# Patient Record
Sex: Female | Born: 1970 | Race: White | Hispanic: No | Smoking: Never smoker
Health system: Southern US, Community
[De-identification: ages and names within clinical notes are randomized; demographics above are authoritative.]

## PROBLEM LIST (undated history)

## (undated) DIAGNOSIS — F419 Anxiety disorder, unspecified: Secondary | ICD-10-CM

## (undated) DIAGNOSIS — Z62810 Personal history of physical and sexual abuse in childhood: Secondary | ICD-10-CM

## (undated) HISTORY — PX: CHOLECYSTECTOMY: SHX55

## (undated) HISTORY — PX: ABDOMINAL HYSTERECTOMY: SHX81

---

## 2004-09-03 ENCOUNTER — Emergency Department (HOSPITAL_COMMUNITY): Admission: EM | Admit: 2004-09-03 | Discharge: 2004-09-03 | Payer: Self-pay | Admitting: *Deleted

## 2006-04-04 IMAGING — CR DG HAND COMPLETE 3+V*L*
4 series · 4 of 4 positions shown · non-contrast
Comparison: none

CLINICAL DATA: Fall.
LEFT KNEE ? 4 VIEW, 09/03/04 AT 6658 HOURS:

[view not recorded (1 of 4)]
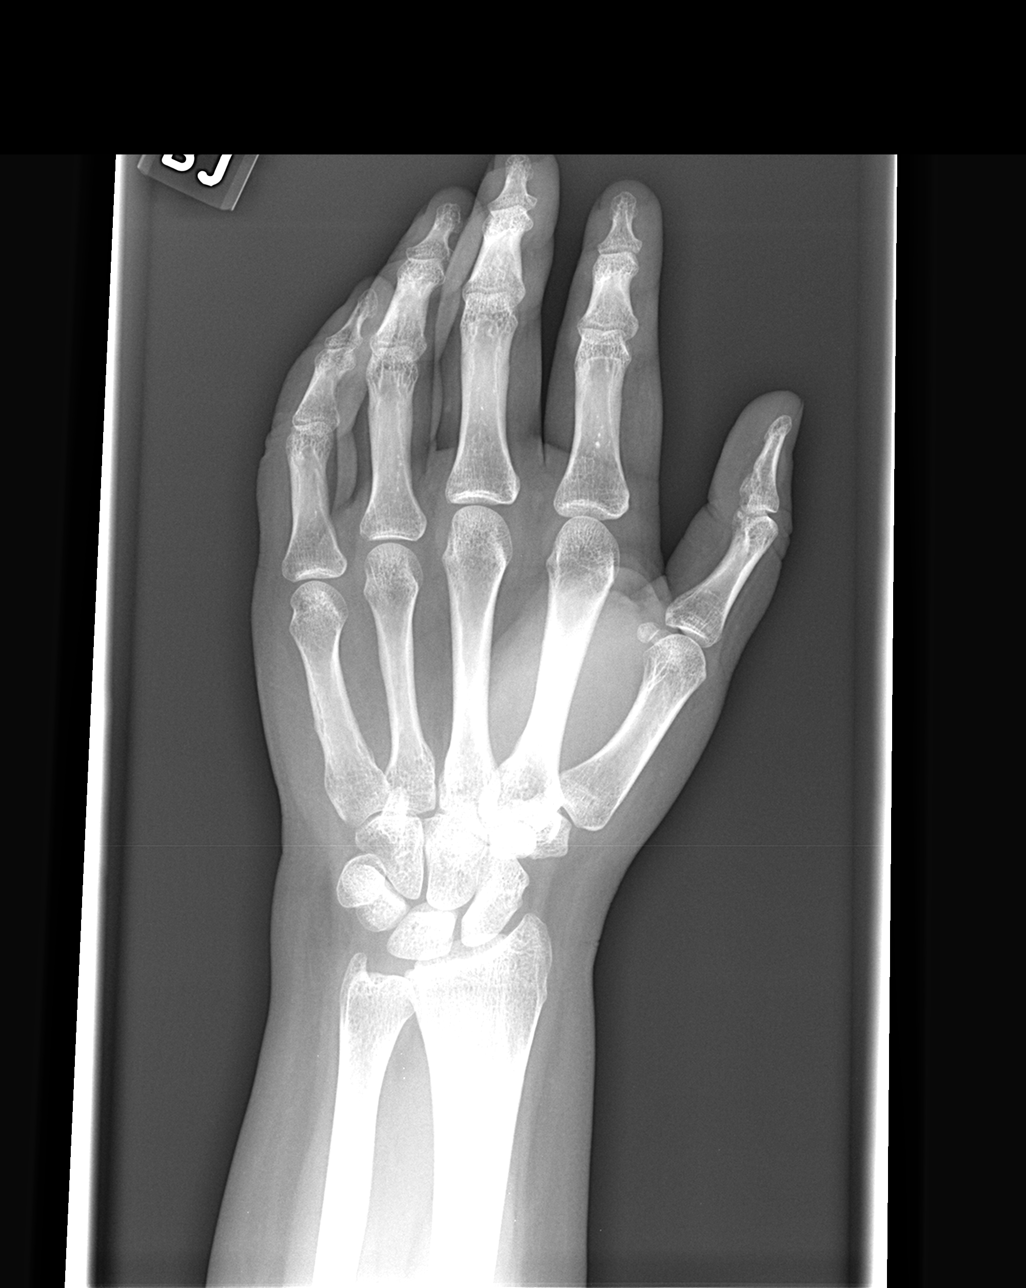

[view not recorded (2 of 4)]
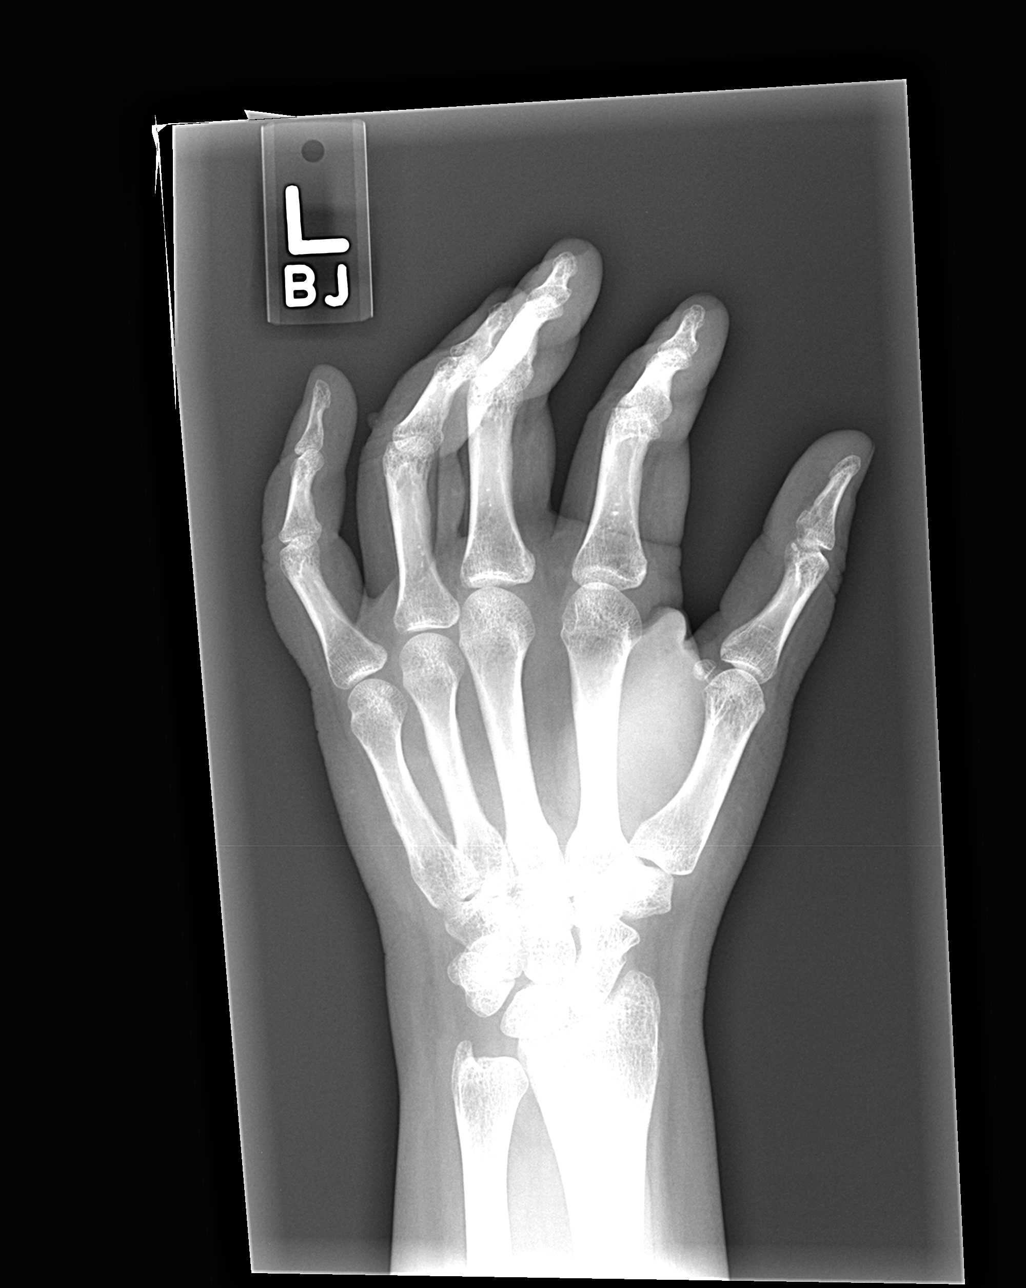

[view not recorded (3 of 4)]
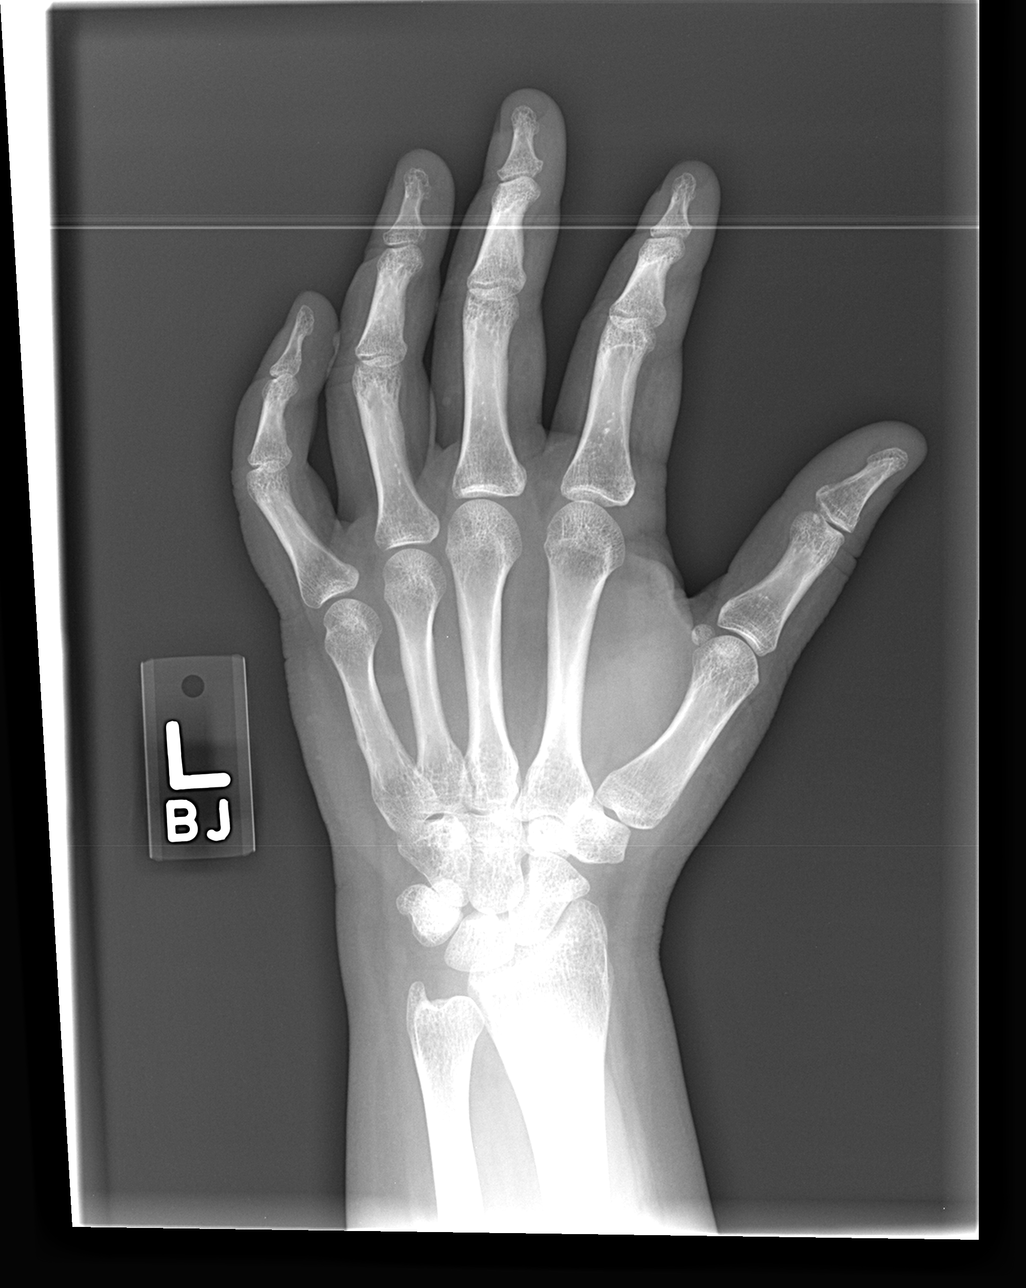

[view not recorded (4 of 4)]
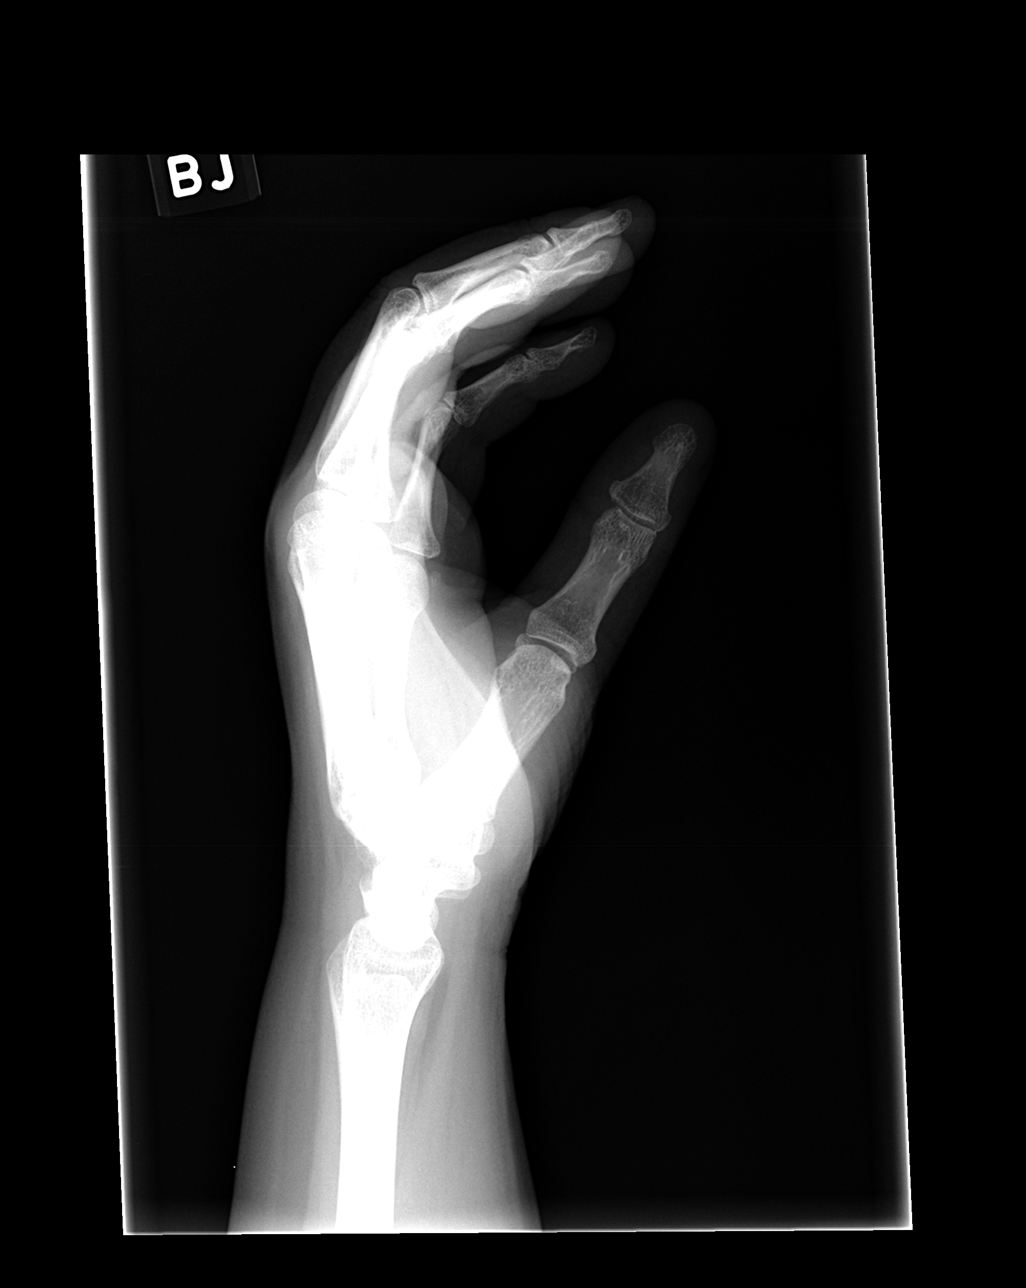

[4 of 4 positions shown; findings below may reference images not displayed]

FINDINGS: No fractures or dislocations are seen.  Radiodensities project over the soft tissues of the calf.  This represents artifact versus foreign bodies in the soft tissues.
IMPRESSION: No fracture.  Radiodensities project over the soft tissues of the calf. 
RIGHT KNEE ? 4 VIEW, 09/03/04 AT 6658 HOURS:
FINDINGS: No fractures or dislocations are seen.  A tiny metallic density projects over the proximal tibia, either artifact or foreign body.
IMPRESSION: No fracture.  Tiny foreign body versus artifact projects over the tibia.
LEFT WRIST  - 4 VIEW, 09/03/04 AT 6658 HOURS:
FINDINGS: No acute fractures or dislocations are seen.  The scaphoid is intact.  A tiny density lateral to the triquetrum is present, either a soft tissue calcification, artifact, or foreign body.  Correlate clinically.
IMPRESSION: No fracture.  Tiny foreign body versus artifact versus soft tissue calcification adjacent to triquetrum is noted. 
LEFT HAND ? 3 VIEW, 09/03/04 AT 6658 HOURS:
FINDINGS: A punctate density between the third and fourth metacarpals is most likely an artifact.  No fractures or dislocations are seen.
IMPRESSION: No evidence of acute bony injury lateral to the triquetrum.

## 2006-05-04 ENCOUNTER — Emergency Department: Payer: Self-pay | Admitting: Emergency Medicine

## 2016-01-08 ENCOUNTER — Emergency Department (HOSPITAL_COMMUNITY)
Admission: EM | Admit: 2016-01-08 | Discharge: 2016-01-09 | Disposition: A | Discharge status: Other Acute Inpatient Hospital | Payer: Self-pay | Attending: Emergency Medicine | Admitting: Emergency Medicine

## 2016-01-08 ENCOUNTER — Encounter (HOSPITAL_COMMUNITY): Payer: Self-pay

## 2016-01-08 DIAGNOSIS — Z79899 Other long term (current) drug therapy: Secondary | ICD-10-CM | POA: Insufficient documentation

## 2016-01-08 DIAGNOSIS — Z79891 Long term (current) use of opiate analgesic: Secondary | ICD-10-CM | POA: Insufficient documentation

## 2016-01-08 DIAGNOSIS — F419 Anxiety disorder, unspecified: Secondary | ICD-10-CM | POA: Insufficient documentation

## 2016-01-08 HISTORY — DX: Personal history of physical and sexual abuse in childhood: Z62.810

## 2016-01-08 HISTORY — DX: Anxiety disorder, unspecified: F41.9

## 2016-01-08 LAB — BASIC METABOLIC PANEL
Anion gap: 6 (ref 5–15)
BUN: 15 mg/dL (ref 6–20)
CALCIUM: 9 mg/dL (ref 8.9–10.3)
CHLORIDE: 104 mmol/L (ref 101–111)
CO2: 28 mmol/L (ref 22–32)
Creatinine, Ser: 0.65 mg/dL (ref 0.44–1.00)
GFR calc Af Amer: >60 mL/min (ref >60)
GLUCOSE: 102 mg/dL — AB (ref 65–99)
Potassium: 3.7 mmol/L (ref 3.5–5.1)
SODIUM: 138 mmol/L (ref 135–145)

## 2016-01-08 LAB — RAPID URINE DRUG SCREEN, HOSP PERFORMED
Amphetamines: NOT DETECTED
BARBITURATES: NOT DETECTED
Benzodiazepines: NOT DETECTED
COCAINE: NOT DETECTED
OPIATES: NOT DETECTED
Tetrahydrocannabinol: NOT DETECTED

## 2016-01-08 LAB — CBC WITH DIFFERENTIAL/PLATELET
BASOS ABS: 0 10*3/uL (ref 0.0–0.1)
BASOS PCT: 0 %
EOS ABS: 0.3 10*3/uL (ref 0.0–0.7)
EOS PCT: 6 %
HCT: 40.5 % (ref 36.0–46.0)
Hemoglobin: 13.6 g/dL (ref 12.0–15.0)
Lymphocytes Relative: 33 %
Lymphs Abs: 1.5 10*3/uL (ref 0.7–4.0)
MCH: 30.4 pg (ref 26.0–34.0)
MCHC: 33.6 g/dL (ref 30.0–36.0)
MCV: 90.6 fL (ref 78.0–100.0)
MONO ABS: 0.3 10*3/uL (ref 0.1–1.0)
Monocytes Relative: 8 %
Neutro Abs: 2.4 10*3/uL (ref 1.7–7.7)
Neutrophils Relative %: 53 %
PLATELETS: 240 10*3/uL (ref 150–400)
RBC: 4.47 MIL/uL (ref 3.87–5.11)
RDW: 12.3 % (ref 11.5–15.5)
WBC: 4.5 10*3/uL (ref 4.0–10.5)

## 2016-01-08 LAB — ETHANOL: Alcohol, Ethyl (B): <5 mg/dL (ref <5)

## 2016-01-08 LAB — PREGNANCY, URINE: Preg Test, Ur: NEGATIVE

## 2016-01-08 MED ORDER — ESTRADIOL 1 MG PO TABS
0.5000 mg | ORAL_TABLET | Freq: Every day | ORAL | Status: DC
  Administered 2016-01-08: 0.5 mg via ORAL
  Filled 2016-01-08 (×2): qty 0.5

## 2016-01-08 MED ORDER — ESTRADIOL 1 MG PO TABS
ORAL_TABLET | ORAL | Status: AC
  Filled 2016-01-08: qty 1

## 2016-01-08 MED ORDER — CLONAZEPAM 0.5 MG PO TABS
0.5000 mg | ORAL_TABLET | Freq: Two times a day (BID) | ORAL | Status: DC
  Administered 2016-01-08: 0.5 mg via ORAL
  Filled 2016-01-08: qty 1

## 2016-01-08 MED ORDER — ZOLPIDEM TARTRATE 5 MG PO TABS
5.0000 mg | ORAL_TABLET | Freq: Every evening | ORAL | Status: DC | PRN
  Administered 2016-01-08: 5 mg via ORAL
  Filled 2016-01-08: qty 1

## 2016-01-08 MED ORDER — ACETAMINOPHEN 325 MG PO TABS
650.0000 mg | ORAL_TABLET | ORAL | Status: DC | PRN
  Administered 2016-01-08 – 2016-01-09 (×3): 650 mg via ORAL
  Filled 2016-01-08 (×3): qty 2

## 2016-01-08 MED ORDER — LORAZEPAM 0.5 MG PO TABS
0.5000 mg | ORAL_TABLET | Freq: Once | ORAL | Status: AC
  Administered 2016-01-08: 0.5 mg via ORAL
  Filled 2016-01-08: qty 1

## 2016-01-08 MED ORDER — SERTRALINE HCL 50 MG PO TABS
50.0000 mg | ORAL_TABLET | Freq: Every day | ORAL | Status: DC
  Administered 2016-01-08: 50 mg via ORAL
  Filled 2016-01-08: qty 1

## 2016-01-08 MED ORDER — ONDANSETRON HCL 4 MG PO TABS
4.0000 mg | ORAL_TABLET | Freq: Three times a day (TID) | ORAL | Status: DC | PRN
  Administered 2016-01-08: 4 mg via ORAL
  Filled 2016-01-08: qty 1

## 2016-01-08 MED ORDER — THYROID 30 MG PO TABS
120.0000 mg | ORAL_TABLET | Freq: Every day | ORAL | Status: DC
  Filled 2016-01-08: qty 1

## 2016-01-08 MED ORDER — ESTRADIOL 0.5 MG PO TABS: 0.5000 mg | ORAL_TABLET | Freq: Every day | ORAL | Status: DC

## 2016-01-08 NOTE — ED Notes (Signed)
Patient complains of headache and nausea. See MAR.
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
## 2016-01-08 NOTE — BH Assessment (Signed)
Per Renata Capriceonrad, DNP - Patient meets criteria for inpatient hospitalization.  [facility].  CSW will seek placement.
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
## 2016-01-08 NOTE — ED Notes (Signed)
Pt's family arrived to visit patient. Approved by this RN.

## 2016-01-08 NOTE — ED Notes (Signed)
Pt ambulatory to restroom at this time. 

## 2016-01-08 NOTE — Progress Notes (Signed)
[facility] [address] [place], [place], and [place]. CSW will continue to follow up in the morning.  [place] Abtsatia Mayumi Summerson, LCSWA Disposition staff 01/08/2016 11:13 PM

## 2016-01-08 NOTE — ED Notes (Signed)
Pt requesting to speak with TTS. States that she does not feel like she needs inpatient treatment. Informed pt that I would let TTS know, but ultimately [facility] and [facility]. Pt verbalized understanding. Per TTS, they will re-assess in the morning, but due to the patient's statements during their assessment, pt would more than likely still be recommended to go inpatient.

## 2016-01-08 NOTE — ED Notes (Signed)
TTS in progress 
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
## 2016-01-08 NOTE — ED Notes (Signed)
Patient unable to provide urine specimen at this time.

## 2016-01-09 NOTE — ED Notes (Signed)
Pt leaving with pelham at this time, ambulatory.  [place] sent with EMTALA, voluntary consent, e-signature, medical necessity, carelink/transfer, and facesheet.  [place] given 2 bags of clothing.

## 2016-01-09 NOTE — ED Notes (Signed)
Pt c/o headache, acetaminophen given as ordered; pt sitting up in bed

## 2016-01-09 NOTE — BH Assessment (Signed)
Pt has been accepted to [facility] ([facility]). Pt may arrive after 0900. Accepting and Attending is Dr.Kescavpal Betti Cruzeddy. Call report to [phone]. Tina,RN has been informed of acceptance.

## 2016-01-09 NOTE — ED Notes (Signed)
Pt has been accepted at [facility] and can be transported after 9am

## 2016-01-09 NOTE — ED Notes (Signed)
Regina Wilkins called to get medical hx on pt

## 2016-01-09 NOTE — ED Notes (Signed)
Meal given to pt.

## 2016-01-09 NOTE — ED Notes (Signed)
Family at bedside. 

## 2016-04-06 ENCOUNTER — Emergency Department (HOSPITAL_COMMUNITY): Payer: Self-pay

## 2016-04-06 ENCOUNTER — Encounter (HOSPITAL_COMMUNITY): Payer: Self-pay | Admitting: Emergency Medicine

## 2016-04-06 ENCOUNTER — Emergency Department (HOSPITAL_COMMUNITY)
Admission: EM | Admit: 2016-04-06 | Discharge: 2016-04-07 | Disposition: A | Discharge status: Home / Self Care | Payer: Self-pay | Attending: Emergency Medicine | Admitting: Emergency Medicine

## 2016-04-06 DIAGNOSIS — R112 Nausea with vomiting, unspecified: Secondary | ICD-10-CM | POA: Insufficient documentation

## 2016-04-06 DIAGNOSIS — R197 Diarrhea, unspecified: Secondary | ICD-10-CM | POA: Insufficient documentation

## 2016-04-06 DIAGNOSIS — M545 Low back pain: Secondary | ICD-10-CM | POA: Insufficient documentation

## 2016-04-06 DIAGNOSIS — J0100 Acute maxillary sinusitis, unspecified: Secondary | ICD-10-CM | POA: Insufficient documentation

## 2016-04-06 DIAGNOSIS — Z79899 Other long term (current) drug therapy: Secondary | ICD-10-CM | POA: Insufficient documentation

## 2016-04-06 DIAGNOSIS — R1013 Epigastric pain: Secondary | ICD-10-CM | POA: Insufficient documentation

## 2016-04-06 LAB — URINALYSIS, ROUTINE W REFLEX MICROSCOPIC
BILIRUBIN URINE: NEGATIVE
Glucose, UA: NEGATIVE mg/dL
HGB URINE DIPSTICK: NEGATIVE
Ketones, ur: 15 mg/dL — AB
Leukocytes, UA: NEGATIVE
NITRITE: NEGATIVE
PROTEIN: NEGATIVE mg/dL
Specific Gravity, Urine: >1.03 — ABNORMAL HIGH (ref 1.005–1.030)
pH: 6 (ref 5.0–8.0)

## 2016-04-06 LAB — CBC
HCT: 39.9 % (ref 36.0–46.0)
Hemoglobin: 13.1 g/dL (ref 12.0–15.0)
MCH: 30.5 pg (ref 26.0–34.0)
MCHC: 32.8 g/dL (ref 30.0–36.0)
MCV: 92.8 fL (ref 78.0–100.0)
PLATELETS: 256 10*3/uL (ref 150–400)
RBC: 4.3 MIL/uL (ref 3.87–5.11)
RDW: 13.1 % (ref 11.5–15.5)
WBC: 9.9 10*3/uL (ref 4.0–10.5)

## 2016-04-06 LAB — COMPREHENSIVE METABOLIC PANEL
ALK PHOS: 85 U/L (ref 38–126)
ALT: 23 U/L (ref 14–54)
AST: 25 U/L (ref 15–41)
Albumin: 4 g/dL (ref 3.5–5.0)
Anion gap: 9 (ref 5–15)
BILIRUBIN TOTAL: 0.7 mg/dL (ref 0.3–1.2)
BUN: 14 mg/dL (ref 6–20)
CALCIUM: 8.7 mg/dL — AB (ref 8.9–10.3)
CO2: 25 mmol/L (ref 22–32)
CREATININE: 0.7 mg/dL (ref 0.44–1.00)
Chloride: 100 mmol/L — ABNORMAL LOW (ref 101–111)
GFR calc non Af Amer: >60 mL/min (ref >60)
Glucose, Bld: 117 mg/dL — ABNORMAL HIGH (ref 65–99)
Potassium: 3.5 mmol/L (ref 3.5–5.1)
SODIUM: 134 mmol/L — AB (ref 135–145)
TOTAL PROTEIN: 7.5 g/dL (ref 6.5–8.1)

## 2016-04-06 LAB — TSH: TSH: 0.919 u[IU]/mL (ref 0.350–4.500)

## 2016-04-06 LAB — RAPID URINE DRUG SCREEN, HOSP PERFORMED
Amphetamines: POSITIVE — AB
Barbiturates: NOT DETECTED
Benzodiazepines: POSITIVE — AB
Cocaine: NOT DETECTED
OPIATES: NOT DETECTED
Tetrahydrocannabinol: NOT DETECTED

## 2016-04-06 LAB — LIPASE, BLOOD: Lipase: 22 U/L (ref 11–51)

## 2016-04-06 LAB — ETHANOL: Alcohol, Ethyl (B): <5 mg/dL (ref <5)

## 2016-04-06 MED ORDER — ONDANSETRON HCL 4 MG/2ML IJ SOLN
4.0000 mg | Freq: Once | INTRAMUSCULAR | Status: AC
  Administered 2016-04-06: 4 mg via INTRAVENOUS
  Filled 2016-04-06: qty 2

## 2016-04-06 MED ORDER — KETOROLAC TROMETHAMINE 30 MG/ML IJ SOLN
30.0000 mg | Freq: Once | INTRAMUSCULAR | Status: AC
  Administered 2016-04-06: 30 mg via INTRAVENOUS
  Filled 2016-04-06: qty 1

## 2016-04-06 MED ORDER — IOPAMIDOL (ISOVUE-300) INJECTION 61%
100.0000 mL | Freq: Once | INTRAVENOUS | Status: AC | PRN
  Administered 2016-04-06: 100 mL via INTRAVENOUS

## 2016-04-06 MED ORDER — ONDANSETRON HCL 4 MG PO TABS: 4.0000 mg | ORAL_TABLET | Freq: Three times a day (TID) | ORAL | 0 refills | Status: AC | PRN

## 2016-04-06 MED ORDER — IOPAMIDOL (ISOVUE-300) INJECTION 61%
INTRAVENOUS | Status: AC
  Filled 2016-04-06: qty 30

## 2016-04-06 MED ORDER — SODIUM CHLORIDE 0.9 % IV BOLUS (SEPSIS)
2000.0000 mL | Freq: Once | INTRAVENOUS | Status: AC
  Administered 2016-04-06: 1000 mL via INTRAVENOUS

## 2016-04-06 MED ORDER — FLUTICASONE PROPIONATE 50 MCG/ACT NA SUSP: 2.0000 | Freq: Every day | NASAL | 0 refills | Status: DC

## 2016-04-06 MED ORDER — OXYMETAZOLINE HCL 0.05 % NA SOLN: 1.0000 | Freq: Two times a day (BID) | NASAL | 0 refills | Status: DC

## 2016-04-06 NOTE — ED Triage Notes (Signed)
Patient complains of body aches, nausea, vomiting and diarrhea that started last night.
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
## 2016-04-06 NOTE — ED Notes (Signed)
Pt was given ginger ale by request of edp.

## 2023-01-20 ENCOUNTER — Other Ambulatory Visit: Payer: Self-pay

## 2023-01-20 ENCOUNTER — Inpatient Hospital Stay
Admission: RE | Admit: 2023-01-20 | Discharge: 2023-01-20 | Disposition: A | Discharge status: Home / Self Care | Payer: Self-pay | Source: Ambulatory Visit | Attending: Neurosurgery | Admitting: Neurosurgery

## 2023-01-20 DIAGNOSIS — Z049 Encounter for examination and observation for unspecified reason: Secondary | ICD-10-CM
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
## 2023-01-25 ENCOUNTER — Ambulatory Visit (INDEPENDENT_AMBULATORY_CARE_PROVIDER_SITE_OTHER): Payer: Self-pay | Admitting: Neurosurgery

## 2023-01-25 ENCOUNTER — Encounter: Payer: Self-pay | Admitting: Neurosurgery

## 2023-01-25 VITALS — BP 100/60 | Ht 65.0 in | Wt 134.0 lb

## 2023-01-25 DIAGNOSIS — M25512 Pain in left shoulder: Secondary | ICD-10-CM

## 2023-01-25 DIAGNOSIS — M7502 Adhesive capsulitis of left shoulder: Secondary | ICD-10-CM

## 2023-01-25 DIAGNOSIS — M25511 Pain in right shoulder: Secondary | ICD-10-CM

## 2023-01-25 DIAGNOSIS — M7501 Adhesive capsulitis of right shoulder: Secondary | ICD-10-CM

## 2023-03-15 ENCOUNTER — Ambulatory Visit: Payer: Self-pay | Admitting: Neurosurgery

## 2023-04-07 ENCOUNTER — Encounter: Payer: Self-pay | Admitting: Neurosurgery
